# Patient Record
Sex: Female | Born: 1965 | Race: White | Hispanic: No | Marital: Single | Smoking: Never smoker
Health system: Southern US, Community
[De-identification: ages and names within clinical notes are randomized; demographics above are authoritative.]

## PROBLEM LIST (undated history)

## (undated) DIAGNOSIS — E7800 Pure hypercholesterolemia, unspecified: Secondary | ICD-10-CM

## (undated) HISTORY — PX: CARDIAC ELECTROPHYSIOLOGY STUDY AND ABLATION: SHX1294

---

## 2004-02-29 ENCOUNTER — Other Ambulatory Visit: Admission: RE | Admit: 2004-02-29 | Discharge: 2004-02-29 | Payer: Self-pay | Admitting: Family Medicine

## 2004-12-26 ENCOUNTER — Encounter: Admission: RE | Admit: 2004-12-26 | Discharge: 2004-12-26 | Payer: Self-pay | Admitting: Family Medicine

## 2005-12-28 ENCOUNTER — Encounter: Admission: RE | Admit: 2005-12-28 | Discharge: 2005-12-28 | Payer: Self-pay | Admitting: Obstetrics and Gynecology

## 2006-01-19 ENCOUNTER — Encounter: Admission: RE | Admit: 2006-01-19 | Discharge: 2006-01-19 | Payer: Self-pay | Admitting: Obstetrics and Gynecology

## 2007-01-21 ENCOUNTER — Encounter: Admission: RE | Admit: 2007-01-21 | Discharge: 2007-01-21 | Payer: Self-pay | Admitting: Obstetrics and Gynecology

## 2007-12-08 ENCOUNTER — Emergency Department (HOSPITAL_COMMUNITY): Admission: EM | Admit: 2007-12-08 | Discharge: 2007-12-08 | Payer: Self-pay | Admitting: Emergency Medicine

## 2008-01-16 ENCOUNTER — Ambulatory Visit: Payer: Self-pay | Admitting: Internal Medicine

## 2008-01-21 ENCOUNTER — Ambulatory Visit: Payer: Self-pay | Admitting: Internal Medicine

## 2008-01-21 LAB — CONVERTED CEMR LAB
BUN: 7 mg/dL (ref 6–23)
Basophils Absolute: 0 10*3/uL (ref 0.0–0.1)
Basophils Relative: 0.7 % (ref 0.0–1.0)
CO2: 26 meq/L (ref 19–32)
Calcium: 9.5 mg/dL (ref 8.4–10.5)
Chloride: 105 meq/L (ref 96–112)
Creatinine, Ser: 0.9 mg/dL (ref 0.4–1.2)
Eosinophils Absolute: 0.1 10*3/uL (ref 0.0–0.6)
Eosinophils Relative: 1.7 % (ref 0.0–5.0)
GFR calc Af Amer: 88 mL/min
GFR calc non Af Amer: 73 mL/min
Glucose, Bld: 90 mg/dL (ref 70–99)
HCT: 40.7 % (ref 36.0–46.0)
Hemoglobin: 13.5 g/dL (ref 12.0–15.0)
INR: 1 (ref 0.8–1.0)
Lymphocytes Relative: 31.1 % (ref 12.0–46.0)
MCHC: 33.1 g/dL (ref 30.0–36.0)
MCV: 93.1 fL (ref 78.0–100.0)
Monocytes Absolute: 0.5 10*3/uL (ref 0.2–0.7)
Monocytes Relative: 9.4 % (ref 3.0–11.0)
Neutro Abs: 3.3 10*3/uL (ref 1.4–7.7)
Neutrophils Relative %: 57.1 % (ref 43.0–77.0)
Platelets: 306 10*3/uL (ref 150–400)
Potassium: 4.3 meq/L (ref 3.5–5.1)
Prothrombin Time: 11.9 s (ref 10.9–13.3)
RBC: 4.37 M/uL (ref 3.87–5.11)
RDW: 12.3 % (ref 11.5–14.6)
Sodium: 139 meq/L (ref 135–145)
WBC: 5.6 10*3/uL (ref 4.5–10.5)
aPTT: 27.2 s (ref 21.7–29.8)

## 2008-01-22 ENCOUNTER — Encounter: Admission: RE | Admit: 2008-01-22 | Discharge: 2008-01-22 | Payer: Self-pay | Admitting: Family Medicine

## 2008-01-28 ENCOUNTER — Ambulatory Visit (HOSPITAL_COMMUNITY): Admission: RE | Admit: 2008-01-28 | Discharge: 2008-01-29 | Payer: Self-pay | Admitting: Internal Medicine

## 2008-01-28 ENCOUNTER — Ambulatory Visit: Payer: Self-pay | Admitting: Internal Medicine

## 2008-03-18 ENCOUNTER — Ambulatory Visit: Payer: Self-pay | Admitting: Internal Medicine

## 2008-04-20 ENCOUNTER — Other Ambulatory Visit: Admission: RE | Admit: 2008-04-20 | Discharge: 2008-04-20 | Payer: Self-pay | Admitting: Family Medicine

## 2009-01-22 ENCOUNTER — Encounter: Admission: RE | Admit: 2009-01-22 | Discharge: 2009-01-22 | Payer: Self-pay | Admitting: Obstetrics and Gynecology

## 2010-12-11 ENCOUNTER — Encounter: Payer: Self-pay | Admitting: Obstetrics and Gynecology

## 2011-04-04 NOTE — Discharge Summary (Signed)
NAMELORIJEAN, HUSSER NO.:  [id number]   MEDICAL RECORD NO.:  [id number]          PATIENT TYPE:  OIB   LOCATION:  3742                         FACILITY:  [facility]   PHYSICIAN:  Doylene Canning. [name], MD    DATE OF BIRTH:  February 22, 1966   DATE OF ADMISSION:  01/28/2008  DATE OF DISCHARGE:  01/29/2008                               DISCHARGE SUMMARY   PROCEDURES:  Electrophysiology study and supraventricular tachycardia  ablation.   PRIMARY FINAL DISCHARGE DIAGNOSIS:  Inducible AVNRT.   SECONDARY DIAGNOSES:  1. Hypertension  2. Dyslipidemia.   TIME OF DISCHARGE:  Thirty one minutes   HOSPITAL COURSE:  Ms. Klingbeil is a 45 year old female with no previous  history of coronary artery disease.  She has been treated by Dr. Everette Rank for SVT, and her medications have been up titrated to the point  that they included high dose beta blocker and Cardizem.  She was still  having breakthrough episodes and these were symptomatic.  She was  referred to Dr. [name] who felt that SVT ablation was indicated and she  came to the hospital for this procedure on January 28, 2008.   She had PVCs on a bundle of His but no pre excitation.  She had  inducible AVNRT which was ablated and successful slow PW modification.  She tolerated the procedure well.   On January 29, 2008, she was maintaining sinus rhythm.  Her sites were  without hematoma or oozing.  Her beta blocker had been decreased to 50  mg a day and a Cardizem was discontinued.  She is to continue on her  other home medications and the aspirin is cut back to 81 mg a day.  Dr.  [name] felt she could be safely discharged home and follow up as an  outpatient.   DISCHARGE INSTRUCTIONS:  She is to increase her activities slowly with  no lifting for a week.  She is to call our office for problems with cath  site.  She is encouraged to stick to a heart-healthy diet.  She will  follow up once with Dr. [name] on April 27 at 10:45.  She is to  follow  up with Dr. Eldridge Dace and Dr. Arvilla Market as needed or as scheduled.   DISCHARGE MEDICATIONS:  1. Toprol XL 100 mg 1/2 tablet daily.  2. Avicor 750/20 mg daily.  3. Yaz 28 one tablet daily.  4. Aspirin 81 mg a day.  5. Multiple vitamin daily.  6. Calcium citrate 400 mg daily.  7. Magnesium citrate 4 mg daily.  8. Omega III fish oil 3 tablets daily.  9. Glucosamine and chondroitin daily.      Theodore Demark, PA-C      Doylene Canning. [name], MD  Electronically Signed    RB/MEDQ  D:  01/29/2008  T:  01/30/2008  Job:  [id number]   cc:   Corky Crafts, MD  Donia Guiles, M.D.

## 2011-04-04 NOTE — Letter (Signed)
January 16, 2008    Corky Crafts, MD  [address] [place], [place] [address]   RE:  KEYSHA, DAMEWOOD  MRN:  [id number]  /  DOB:  Dec 01, 1965   Dear Vonna Kotyk:   Thank you for referring Ms. Izabelle Daus for EP evaluation.  As you know she  is a very pleasant 41 room with a history of tachy palpitations and  documented SVT at rates of over 220 beats per minute.  The patient was  seen today in our office and her history is basically as you described  in your notes in the past.  She notes that she has had palpitations off  and on for many, many years though in the last year or so they have  increased in frequency and severity despite being on beta-blockers and  now calcium channel blockers.  Her EKG is fairly impressive in that she  has a narrow QRS tachycardia at rates of approximately 220-240 beats per  minute.  The patient tachycardia in the past as terminated with PVCs.  She did not have any evidence of ventricular pre-excitation on 12-lead  EKG.   I have discussed the options with Ms. Berenguer in detail.  Because she has  had recurrent episodes of SVT, which are very fast despite medical  therapy with beta-blockers and now calcium channel blockers,  I have  recommend proceeding with catheter ablation of her SVT.  Based on  history, my suspicion that this she will have a concealed accessory  pathway responsible for  episodes of SVT.  Having said this, however, would like to proceed with  catheter ablation and this be scheduled earliest possible convenient  time.   Thanks again for referring Ms. Huhta for EP evaluation.    Sincerely,      Doylene Canning. [name], MD  Electronically Signed    GWT/MedQ  DD: 01/16/2008  DT: 01/17/2008  Job #: [id number]   CC:    Donia Guiles, M.D.

## 2011-04-04 NOTE — Assessment & Plan Note (Signed)
[facility]                         ELECTROPHYSIOLOGY OFFICE NOTE   NAME:Deborah Davenport, Deborah Davenport                             MRN:          [id number]  DATE:03/18/2008                            DOB:          [birth year]    Ms. Lightsey returns today for follow-up.  She is very pleasant young woman  with a history of SVT and hypertension who is status post catheter  ablation, who returns today for follow-up.  She has been stable.  She  has had no recurrent symptoms of SVT.  She denies chest pain.  She does  still complain of some fatigue but is still on Toprol 50 mg a day.  Other medicines include Advicor, Yaz, multiple vitamins and aspirin 81  mg a day.   PHYSICAL EXAM:  She is a pleasant, well-appearing young woman in no  distress.  Blood pressure 110/80, the pulse 73 and regular, respirations were 18.  The weight was 171 pounds.  NECK:  No jugular venous distention.  LUNGS:  Clear bilaterally to auscultation.  There are no wheezes, rales  or rhonchi.   The EKG demonstrates sinus rhythm with occasional PAC.   IMPRESSION:  1. Symptomatic supraventricular tachycardia.  2. Status post electrophysiology study and catheter ablation.   DISCUSSION:  Ms. Maxim is stable.  I have asked that she wean herself off  of her beta blocker by decreasing to 25 mg a day for a week and then 25  mg every other day for another week before stopping her beta blocker  altogether.  I will see her back in follow up on a p.r.n. basis.     Doylene Canning. [name], MD  Electronically Signed    GWT/MedQ  DD: 03/18/2008  DT: 03/18/2008  Job #: [id number]   cc:   Corky Crafts, MD  Donia Guiles, M.D.

## 2011-04-04 NOTE — Op Note (Signed)
Deborah Davenport, STONEHAM NO.:  [id number]   MEDICAL RECORD NO.:  [id number]          PATIENT TYPE:  OIB   LOCATION:  3742                         FACILITY:  [facility]   PHYSICIAN:  Deborah Davenport. Deborah Ridgel, MD    DATE OF BIRTH:  January 05, 1966   DATE OF PROCEDURE:  01/28/2008  DATE OF DISCHARGE:                               OPERATIVE REPORT   PROCEDURE PERFORMED:  Electrophysiologic study and radio frequency  catheter ablation of AV node reentry tachycardia.   INTRODUCTION:  The patient is a very pleasant 45 year old patient of Dr.  Eldridge Dace who has a history of tachy palpitations for many years.  She  notes that her heart racing began back as a teenager.  She has been  documented to have SVT at rates of 220 beats per minute despite medical  therapy with both Toprol and Cardizem.  She is now referred for catheter  ablation.   PROCEDURE:  After informed consent was obtained, the patient was taken  to the diagnostic EP lab in a fasting state.  After the usual  preparation and draping, intravenous fentanyl, midazolam, and Valium  were given for sedation.  A 6-French hexapolar catheter was inserted  percutaneously into the right jugular vein and advanced to the coronary  sinus.  A 5-French quadripolar catheter was inserted percutaneously in  the right femoral vein and advanced to the RV apex.  A 5-French  quadripolar catheter was inserted percutaneously in the right femoral  vein and advanced to the His bundle region.  This was all carried out  under fluoroscopic guidance.  During catheter manipulation, the patient  developed SVT at a cycle length of 340 to 330 millifractures and did not  demonstrate atrial pre-excitation.  Ventricular pacing during  tachycardia demonstrated VAV conduction sequence.  Mapping demonstrated  midline atrial activation.  At this point, the pacing was carried out  from the coronary sinus at 290 milliseconds, terminating the  tachycardia.  Rapid ventricular  pacing was then carried out from the RV  apex demonstrating a VA Wenckebach cycle length of 320 milliseconds.  During rapid ventricular pacing, the atrial activation was midline and  decremental.  Next, programmed ventricular stimulation was carried out  from the RV apex at a base drive cycle length of 045 milliseconds with  the S1-S2 interval stepwise decreased down to 230 milliseconds where  ventricular refractoriness was observed.  During programmed ventricular  stimulation, the atrial activation sequence was midline and decremental.  Next, programmed atrial stimulation was carried out from the coronary  sinus in the right atrium at a base drive cycle length of 409  milliseconds, the S1-S2 interval stepwise decreased down to 300  milliseconds resulting in the initiation of SVT.  Again, this was AV  node reentry tachycardia, the cycle length varied between 360 and 330  milliseconds.  The patient's tachycardia was terminated with pacing.  Next, rapid atrial pacing was carried out from the coronary sinus in the  high right atrium at a base drive cycle length of 811 milliseconds  stepwise decreased down to 350 milliseconds resulting  in the initiation  of tachycardia once again, terminated with rapid atrial pacing.  At this  point, a 7-French quadripolar ablation catheter was inserted into the  right femoral vein and advanced into the His bundle region and the  Koch's triangle region where mapping was carried out.  Mapping  demonstrated an unusually small Koch's triangle.  A total of two RF  energy applications were delivered to the slow pathway region.  During  RF energy application, there was accelerated junctional rhythm noted.  Following this, the patient was observed for 30 minutes.  During this  time, rapid atrial pacing and programmed atrial stimulation were again  carried out demonstrating no inducible SVT.  There was residual slow  pathway conduction with jumps and echo beats.   However, because of the  patient's extremely small slow pathway region, it was deemed not  appropriate to do any additional ablation for concerns of the  development of complete heart block.  It should be noted that the PR  interval was less than the RR interval following ablation.  At this  point, the catheters were removed, hemostasis was assured, and the  patient was returned to her room in satisfactory condition.   COMPLICATIONS:  There were no immediate procedure complications.   RESULTS:  A. Baseline ECG. The baseline ECG demonstrates sinus rhythm  with normal axis intervals.  There is no pre-excitation noted.  B. Baseline intervals.  The sinus node cycle length was 750  milliseconds.  QRS duration was 80 milliseconds.  The HV interval was 54  milliseconds.  C.  Rapid ventricular pacing.  Rapid ventricular pacing was carried out  from the RV apex demonstrating a VA Wenckebach cycle length of 320  milliseconds.  During rapid ventricular pacing, the atrial activation  sequence was midline and decremental.  D.  Programmed ventricular stimulation.  Programmed ventricular  stimulation was carried out from the RV apex at a base drive cycle  length of 045 milliseconds.  The S1-S2 interval was stepwise decreased  down to 230 milliseconds where activation was midline and decremental.  E.  Rapid atrial pacing.  Rapid atrial pacing was carried out from the  coronary sinus and the right atrium at a pacing cycle length of 600  milliseconds and stepwise decreased down to 350 milliseconds resulting  in the initiation of SVT.  Following ablation, rapid atrial pacing was  carried out down to 320 milliseconds where AV Wenckebach was observed  and there was no inducible SVT.  F.  Programmed atrial stimulation.  Programmed atrial stimulation was  carried out from the coronary sinus and high right atrium at a base  drive cycle length of 409 milliseconds.  The S1-S2 interval was stepwise  decreased  down to 230 milliseconds following ablation.  Prior to this,  an S1-S2 coupling interval of 500/300, there was inducible SVT.  G.  Arrhythmias observed.  1. AV node reentry tachycardia. Initiation was with programmed atrial      stimulation, rapid atrial pacing, end spontaneous, the duration was      sustained, the termination was either spontaneous or with rapid      atrial pacing.      a.     Mapping.  Mapping of Koch's triangle demonstrated an       unusually small Koch's triangle.      b.     RF energy application.  A total of two RF energy       applications were delivered.  During the  RF energy application,       there was accelerated junctional rhythm.   CONCLUSION:  This study demonstrates successful electrophysiologic and  RF catheter ablation with a total of two RF energy applications  delivered to a very small Koch's triangle resulting in rendering the  tachycardia not inducible.      Deborah Davenport. Deborah Ridgel, MD  Electronically Signed     GWT/MEDQ  D:  01/28/2008  T:  01/29/2008  Job:  [id number]   cc:   Corky Crafts, MD  Donia Guiles, M.D.

## 2011-04-04 NOTE — Assessment & Plan Note (Signed)
[facility]                         ELECTROPHYSIOLOGY OFFICE NOTE   NAME:Deborah Davenport, Deborah Davenport                             MRN:          [id number]  DATE:01/16/2008                            DOB:          June 05, 1966    HISTORY:  Ms. Ehler is referred today by Dr. Everette Rank for evaluation  of recurrent symptomatic SVT despite medical therapy.  The patient is a  very pleasant 45 year old woman with a history of tachy palpitations  dating back since she was a young adult.  These episodes initially were  very brief and very infrequent.  Over the last year or two her episodes  have increased in frequency and severity and recently she underwent  exercise treadmill testing where she went into SVT which was documented  to be a rates of way over 220 beats per minute.  She is now referred for  consideration for catheter ablation.  The patient has been on Toprol 100  a day and now Cardizem for several weeks.   PAST MEDICAL HISTORY:  Her past medical history is notable for  hypertension.  She also has a history of dyslipidemia.  The patient does  note that during her SVT she experiences dizziness and lightheadedness,  shortness of breath but does not have chest pressure and has never had  frank syncope.   FAMILY HISTORY:  Her family history is notable for both parents with  hypertension.  Her father has coronary disease.   SOCIAL HISTORY:  The patient is married.  She denies tobacco or ethanol  abuse.   REVIEW OF SYSTEMS:  Her review of systems is negative except as noted in  the HPI.   PHYSICAL EXAMINATION:  GENERAL:  The physical examination is notable for  her being a pleasant, well-appearing woman in no acute distress.  VITAL SIGNS:  The blood pressure was 125/75.  The pulse was 77 and  regular, respirations were 18.  The weight was 171 pounds.  HEENT:  Normocephalic and atraumatic.  Pupils equal and round.  Oropharynx moist.  Sclerae anicteric.  NECK:  The  neck revealed no jugular venous distention.  There was no  thyromegaly.  The trachea was midline.  Carotids are 2+ and symmetric.  LUNGS:  Clear bilaterally to auscultation.  No wheezes, rales or rhonchi  are present.  There is no increased work of breathing.  CARDIOVASCULAR:  Exam revealed a regular rate and rhythm with normal S1  and S2.  There are no murmurs, rubs, gallops present.  The PMI was not  laterally displaced nor was it enlarged.  ABDOMEN:  The abdominal exam was soft, nontender, nondistended.  There  was no organomegaly.  Bowel sounds are present.  There is no rebound or  guarding.  EXTREMITIES:  The extremities demonstrated no cyanosis, clubbing or  edema.  The pulses were 2+ and symmetric.  NEUROLOGIC:  Alert and oriented x3, cranial nerves II-XII intact.  Strength was 5/5 and symmetric.   EKG demonstrates sinus rhythm with normal axis and intervals.   IMPRESSION:  1. Recurrent episodes of supraventricular tachycardia despite medical  therapy.  2. Hypertension.  3. Dyslipidemia.   DISCUSSION:  I have discussed the treatment options with the patient in  detail.  The risks, benefits, goals of expectations of  electrophysiological study and catheter ablation of her SVT have been  discussed and she would like to proceed with catheter ablation as soon  as possible.  This will be scheduled at the earliest possible convenient  time.     Doylene Canning. [name], MD  Electronically Signed    GWT/MedQ  DD: 01/16/2008  DT: 01/17/2008  Job #: [id number]   cc:   Donia Guiles, M.D.  Corky Crafts, MD

## 2011-08-11 LAB — POCT CARDIAC MARKERS
CKMB, poc: 1
Myoglobin, poc: 80
Operator id: 257131
Troponin i, poc: <0.05

## 2011-08-11 LAB — DIFFERENTIAL
Basophils Absolute: 0
Basophils Relative: 1
Eosinophils Absolute: 0.1
Eosinophils Relative: 1
Lymphocytes Relative: 25
Lymphs Abs: 1.4
Monocytes Absolute: 0.4
Monocytes Relative: 7
Neutro Abs: 3.7
Neutrophils Relative %: 66

## 2011-08-11 LAB — BASIC METABOLIC PANEL
BUN: 9
CO2: 23
Calcium: 9
Chloride: 109
Creatinine, Ser: 0.87
GFR calc Af Amer: >60
GFR calc non Af Amer: >60
Glucose, Bld: 99
Potassium: 3.1 — ABNORMAL LOW
Sodium: 139

## 2011-08-11 LAB — CBC
HCT: 40.8
Hemoglobin: 13.8
MCHC: 33.8
MCV: 91.2
Platelets: 323
RBC: 4.48
RDW: 13.5
WBC: 5.6

## 2011-08-11 LAB — TSH: TSH: 2.33

## 2012-04-08 ENCOUNTER — Encounter (HOSPITAL_COMMUNITY): Payer: Self-pay | Admitting: Emergency Medicine

## 2012-04-08 ENCOUNTER — Emergency Department (HOSPITAL_COMMUNITY): Payer: No Typology Code available for payment source

## 2012-04-08 ENCOUNTER — Emergency Department (HOSPITAL_COMMUNITY)
Admission: EM | Admit: 2012-04-08 | Discharge: 2012-04-08 | Disposition: A | Discharge status: Home / Self Care | Payer: No Typology Code available for payment source | Attending: Emergency Medicine | Admitting: Emergency Medicine

## 2012-04-08 DIAGNOSIS — S2220XA Unspecified fracture of sternum, initial encounter for closed fracture: Secondary | ICD-10-CM | POA: Insufficient documentation

## 2012-04-08 DIAGNOSIS — Y92410 Unspecified street and highway as the place of occurrence of the external cause: Secondary | ICD-10-CM | POA: Insufficient documentation

## 2012-04-08 HISTORY — DX: Pure hypercholesterolemia, unspecified: E78.00

## 2012-04-08 LAB — POCT I-STAT, CHEM 8
BUN: 6 mg/dL (ref 6–23)
Calcium, Ion: 1.19 mmol/L (ref 1.12–1.32)
Chloride: 108 mEq/L (ref 96–112)
Creatinine, Ser: 0.8 mg/dL (ref 0.50–1.10)
Glucose, Bld: 106 mg/dL — ABNORMAL HIGH (ref 70–99)
HCT: 39 % (ref 36.0–46.0)
Hemoglobin: 13.3 g/dL (ref 12.0–15.0)
Potassium: 3.5 mEq/L (ref 3.5–5.1)
Sodium: 143 mEq/L (ref 135–145)
TCO2: 24 mmol/L (ref 0–100)

## 2012-04-08 MED ORDER — IBUPROFEN 800 MG PO TABS
800.0000 mg | ORAL_TABLET | Freq: Once | ORAL | Status: AC
  Administered 2012-04-08: 800 mg via ORAL
  Filled 2012-04-08: qty 1

## 2012-04-08 MED ORDER — HYDROCODONE-ACETAMINOPHEN 5-325 MG PO TABS: 1.0000 | ORAL_TABLET | Freq: Four times a day (QID) | ORAL | Status: AC | PRN

## 2012-04-08 MED ORDER — CYCLOBENZAPRINE HCL 10 MG PO TABS: 10.0000 mg | ORAL_TABLET | Freq: Two times a day (BID) | ORAL | Status: AC | PRN

## 2012-04-08 MED ORDER — IOHEXOL 300 MG/ML  SOLN
80.0000 mL | Freq: Once | INTRAMUSCULAR | Status: AC | PRN
  Administered 2012-04-08: 80 mL via INTRAVENOUS

## 2012-04-08 NOTE — ED Notes (Signed)
Patient transported to CT 
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
## 2012-08-05 ENCOUNTER — Other Ambulatory Visit: Payer: Self-pay | Admitting: Obstetrics and Gynecology

## 2012-08-05 DIAGNOSIS — R928 Other abnormal and inconclusive findings on diagnostic imaging of breast: Secondary | ICD-10-CM

## 2012-08-07 ENCOUNTER — Ambulatory Visit
Admission: RE | Admit: 2012-08-07 | Discharge: 2012-08-07 | Disposition: A | Discharge status: Home / Self Care | Payer: BC Managed Care – PPO | Source: Ambulatory Visit | Attending: Obstetrics and Gynecology | Admitting: Obstetrics and Gynecology

## 2012-08-07 DIAGNOSIS — R928 Other abnormal and inconclusive findings on diagnostic imaging of breast: Secondary | ICD-10-CM

## 2012-09-30 ENCOUNTER — Other Ambulatory Visit: Payer: Self-pay | Admitting: Family Medicine

## 2012-09-30 ENCOUNTER — Ambulatory Visit
Admission: RE | Admit: 2012-09-30 | Discharge: 2012-09-30 | Disposition: A | Discharge status: Home / Self Care | Payer: BC Managed Care – PPO | Source: Ambulatory Visit | Attending: Family Medicine | Admitting: Family Medicine

## 2012-09-30 DIAGNOSIS — M25559 Pain in unspecified hip: Secondary | ICD-10-CM

## 2013-09-13 IMAGING — CR DG PELVIS 1-2V
1 series · 1 of 1 positions shown · non-contrast
Comparison: None.

CLINICAL DATA: Right hip pain for 3 weeks, no recent injury

PELVIS - 1-2 VIEW

[t pelvis a.p.]
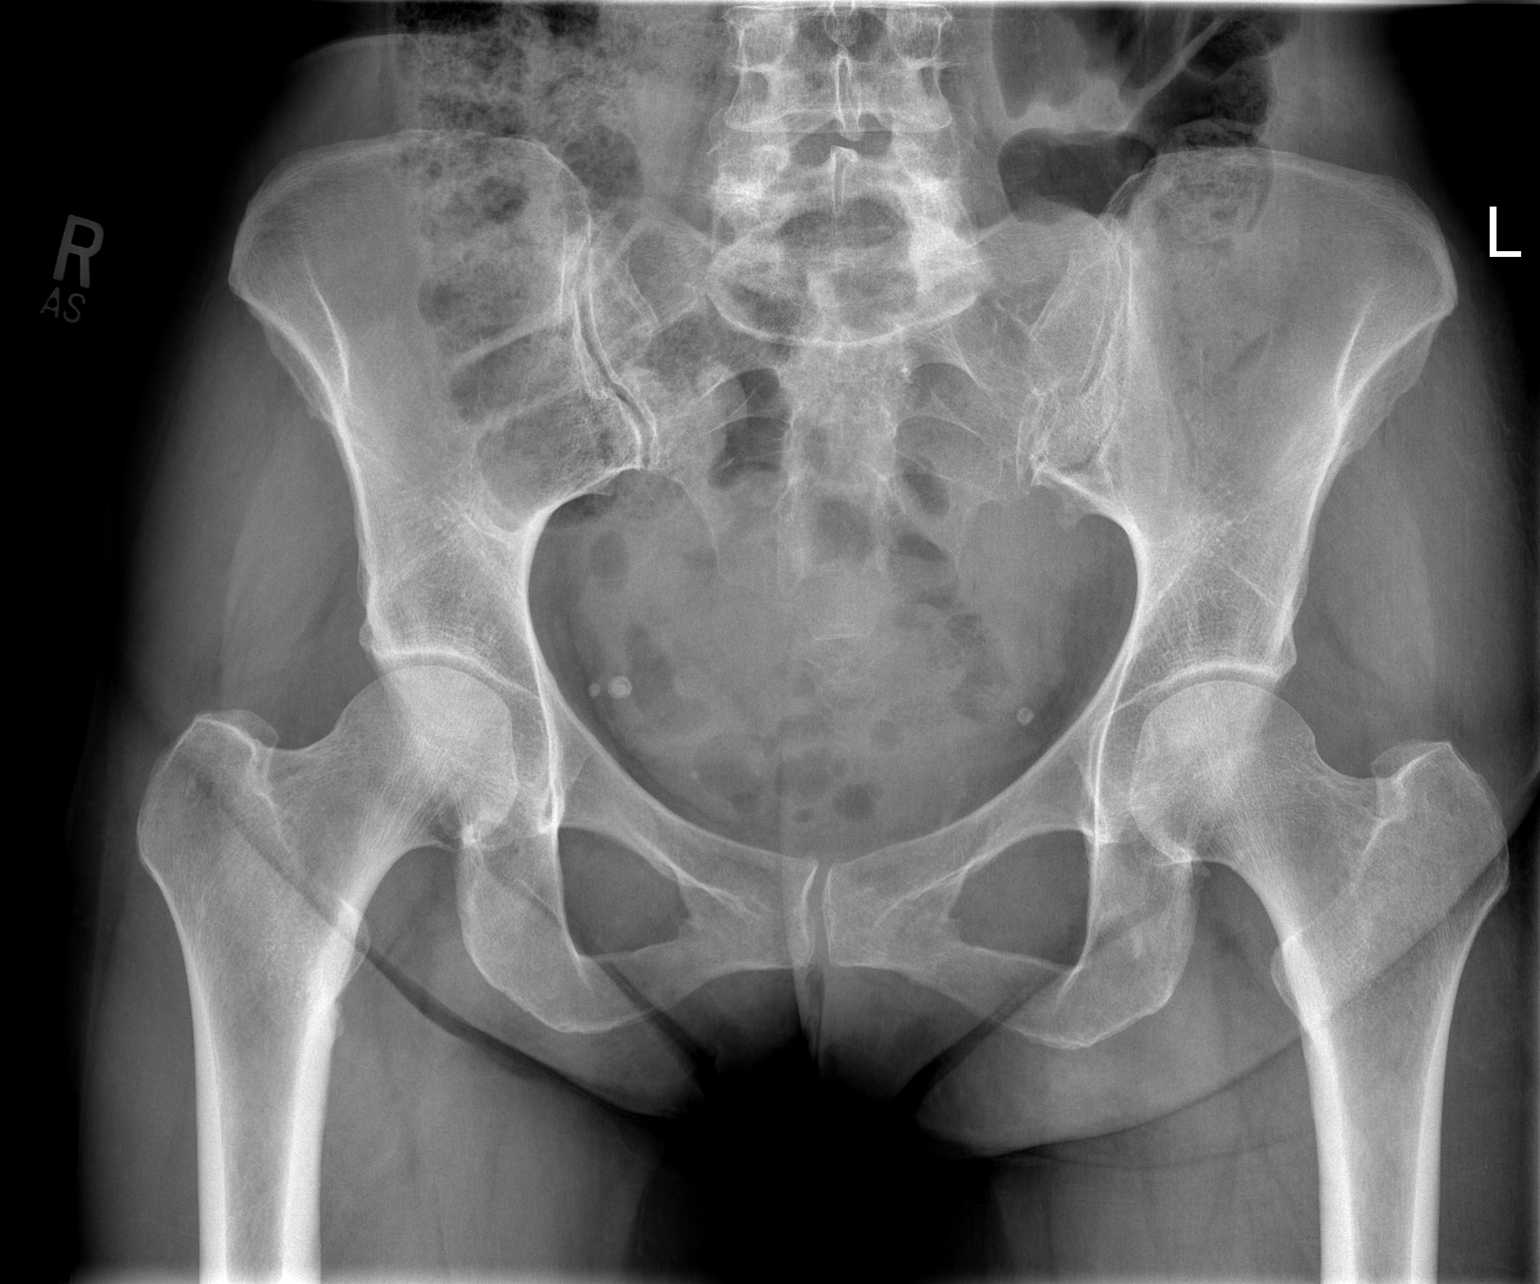

[1 of 1 positions shown; findings below may reference images not displayed]

FINDINGS: Both hip joint spaces are within normal limits.  No
significant degenerative change is seen.  The pelvic rami are
intact.  The SI joints are corticated.
IMPRESSION: Negative.

## 2013-09-13 IMAGING — CR DG HIP COMPLETE 2+V*R*
2 series · 2 of 2 positions shown · non-contrast
Comparison: None

CLINICAL DATA: Right hip pain 3 weeks, no recent injury

RIGHT HIP - COMPLETE 2+ VIEW

[t hip ap right]
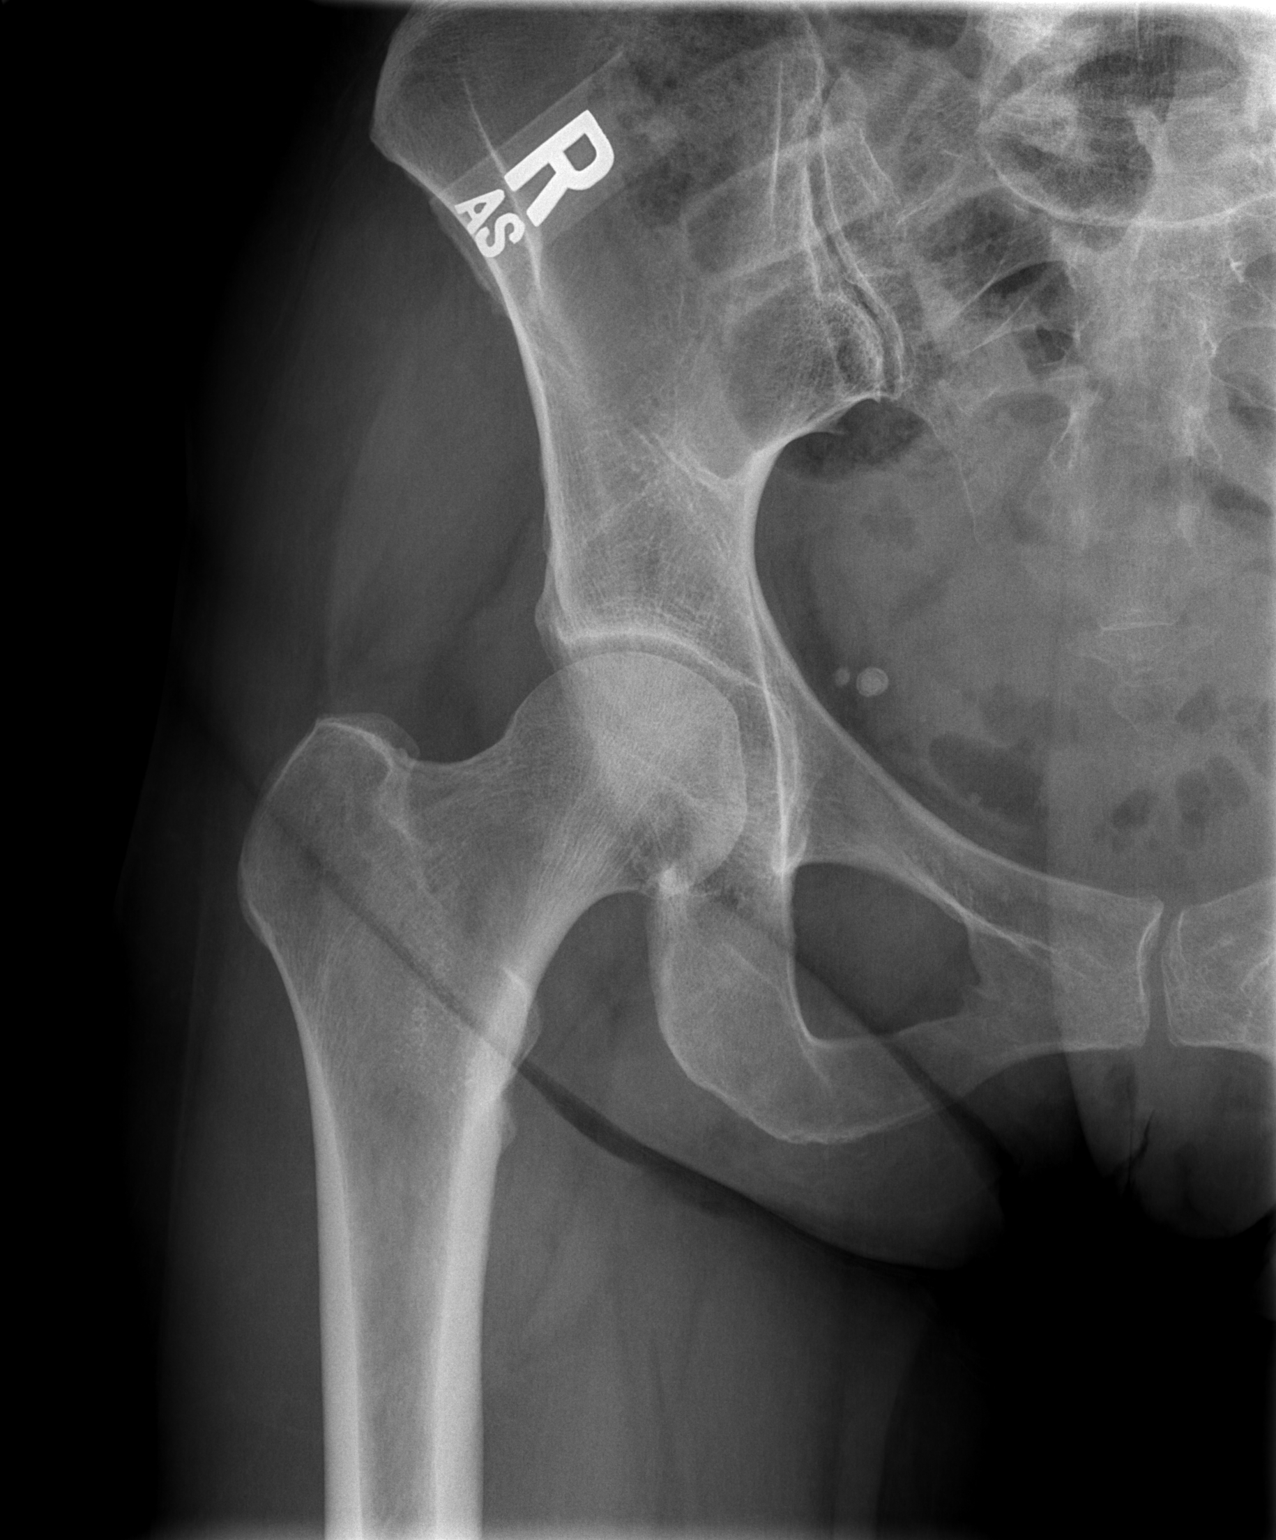

[t hip frog leg right]
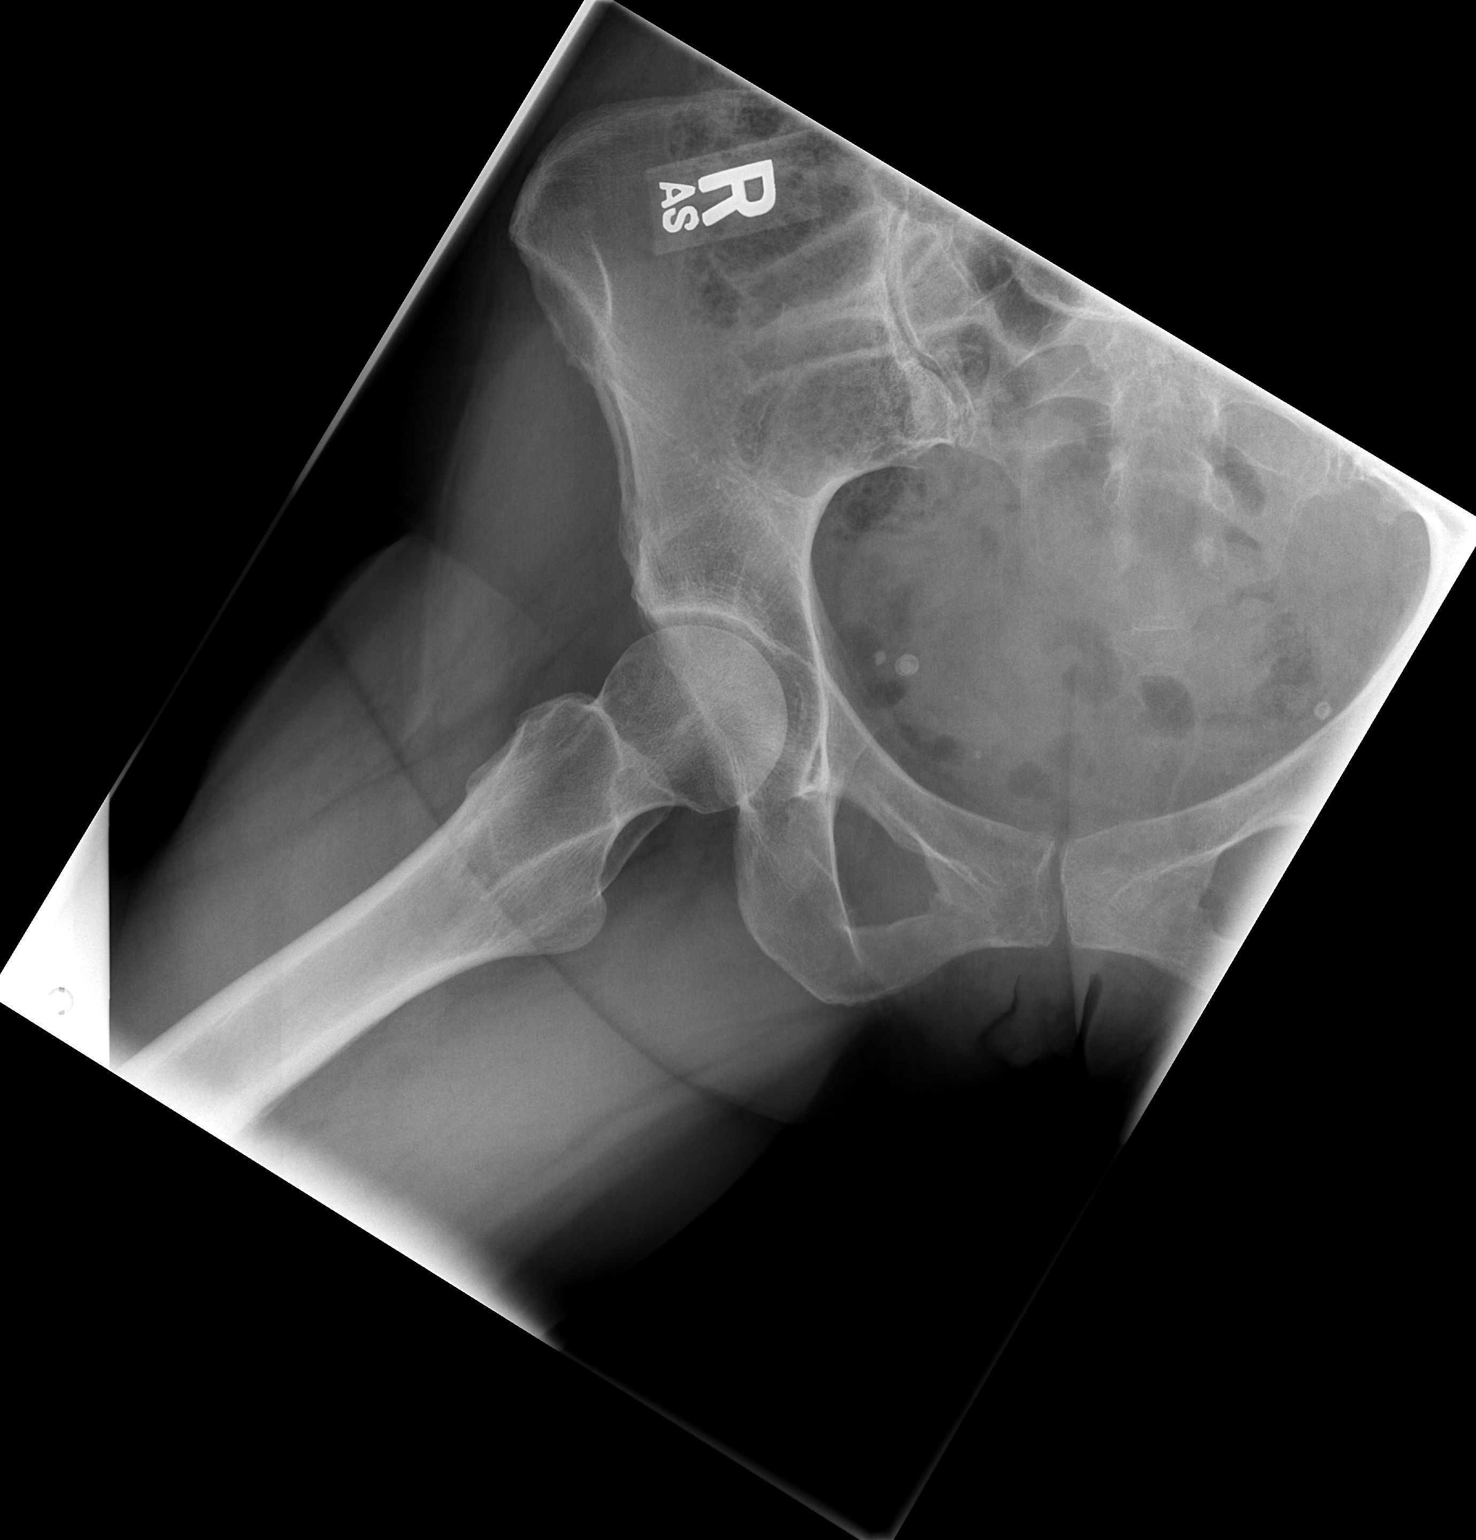

[2 of 2 positions shown; findings below may reference images not displayed]

FINDINGS: The right hip joint space appears normal.  No significant
degenerative change is seen.  No acute bony abnormality is noted.
The right ramus is intact.  The right SI joint appears normal.
IMPRESSION: Negative.]

## 2014-10-28 ENCOUNTER — Ambulatory Visit
Admission: RE | Admit: 2014-10-28 | Discharge: 2014-10-28 | Disposition: A | Discharge status: Home / Self Care | Payer: BC Managed Care – PPO | Source: Ambulatory Visit | Attending: Physician Assistant | Admitting: Physician Assistant

## 2014-10-28 ENCOUNTER — Other Ambulatory Visit: Payer: Self-pay | Admitting: Physician Assistant

## 2014-10-28 DIAGNOSIS — M533 Sacrococcygeal disorders, not elsewhere classified: Secondary | ICD-10-CM

## 2015-11-08 ENCOUNTER — Encounter: Payer: Self-pay | Admitting: Skilled Nursing Facility1

## 2015-11-08 ENCOUNTER — Encounter: Payer: BLUE CROSS/BLUE SHIELD | Attending: Family Medicine | Admitting: Skilled Nursing Facility1

## 2015-11-08 VITALS — Ht 67.0 in | Wt 199.0 lb

## 2015-11-08 DIAGNOSIS — Z713 Dietary counseling and surveillance: Secondary | ICD-10-CM | POA: Diagnosis not present

## 2015-11-08 DIAGNOSIS — E669 Obesity, unspecified: Secondary | ICD-10-CM

## 2015-11-08 DIAGNOSIS — Z6831 Body mass index (BMI) 31.0-31.9, adult: Secondary | ICD-10-CM | POA: Insufficient documentation
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
## 2015-11-08 NOTE — Patient Instructions (Addendum)
-  Make your bedroom conducive to sleep -Focus on your hunger and fullness cues -It is okay to eat sweets -To make your breakfast balanced: fruit, cereal, nuts -Try to be physically active three days a week -Only 1/2 cup cereal

## 2017-10-09 ENCOUNTER — Other Ambulatory Visit: Payer: Self-pay | Admitting: Family Medicine

## 2017-10-09 ENCOUNTER — Ambulatory Visit
Admission: RE | Admit: 2017-10-09 | Discharge: 2017-10-09 | Disposition: A | Discharge status: Home / Self Care | Payer: BLUE CROSS/BLUE SHIELD | Source: Ambulatory Visit | Attending: Family Medicine | Admitting: Family Medicine

## 2017-10-09 DIAGNOSIS — R1011 Right upper quadrant pain: Secondary | ICD-10-CM

## 2018-04-17 ENCOUNTER — Ambulatory Visit
Admission: RE | Admit: 2018-04-17 | Discharge: 2018-04-17 | Disposition: A | Discharge status: Home / Self Care | Payer: BLUE CROSS/BLUE SHIELD | Source: Ambulatory Visit | Attending: Family Medicine | Admitting: Family Medicine

## 2018-04-17 ENCOUNTER — Other Ambulatory Visit: Payer: Self-pay | Admitting: Family Medicine

## 2018-04-17 DIAGNOSIS — R05 Cough: Secondary | ICD-10-CM

## 2018-04-17 DIAGNOSIS — R059 Cough, unspecified: Secondary | ICD-10-CM

## 2018-04-25 ENCOUNTER — Ambulatory Visit
Admission: RE | Admit: 2018-04-25 | Discharge: 2018-04-25 | Disposition: A | Discharge status: Home / Self Care | Payer: BLUE CROSS/BLUE SHIELD | Source: Ambulatory Visit | Attending: Physician Assistant | Admitting: Physician Assistant

## 2018-04-25 ENCOUNTER — Other Ambulatory Visit: Payer: Self-pay | Admitting: Physician Assistant

## 2018-04-25 DIAGNOSIS — J189 Pneumonia, unspecified organism: Secondary | ICD-10-CM

## 2018-05-07 ENCOUNTER — Other Ambulatory Visit: Payer: Self-pay | Admitting: Family Medicine

## 2018-05-07 DIAGNOSIS — J189 Pneumonia, unspecified organism: Secondary | ICD-10-CM

## 2018-05-07 DIAGNOSIS — J181 Lobar pneumonia, unspecified organism: Principal | ICD-10-CM

## 2018-05-22 ENCOUNTER — Ambulatory Visit
Admission: RE | Admit: 2018-05-22 | Discharge: 2018-05-22 | Disposition: A | Discharge status: Home / Self Care | Payer: BLUE CROSS/BLUE SHIELD | Source: Ambulatory Visit | Attending: Family Medicine | Admitting: Family Medicine

## 2018-05-22 DIAGNOSIS — J189 Pneumonia, unspecified organism: Secondary | ICD-10-CM

## 2018-05-22 DIAGNOSIS — J181 Lobar pneumonia, unspecified organism: Principal | ICD-10-CM

## 2018-05-22 MED ORDER — IOPAMIDOL (ISOVUE-300) INJECTION 61%
75.0000 mL | Freq: Once | INTRAVENOUS | Status: AC | PRN
  Administered 2018-05-22: 75 mL via INTRAVENOUS

## 2019-04-08 IMAGING — DX DG CHEST 2V
2 series · 2 of 2 positions shown · non-contrast
Comparison: Radiographs April 17, 2018.

CLINICAL DATA: Pneumonia.

EXAM:
CHEST - 2 VIEW

[dg chest 2 view (1 of 2)]
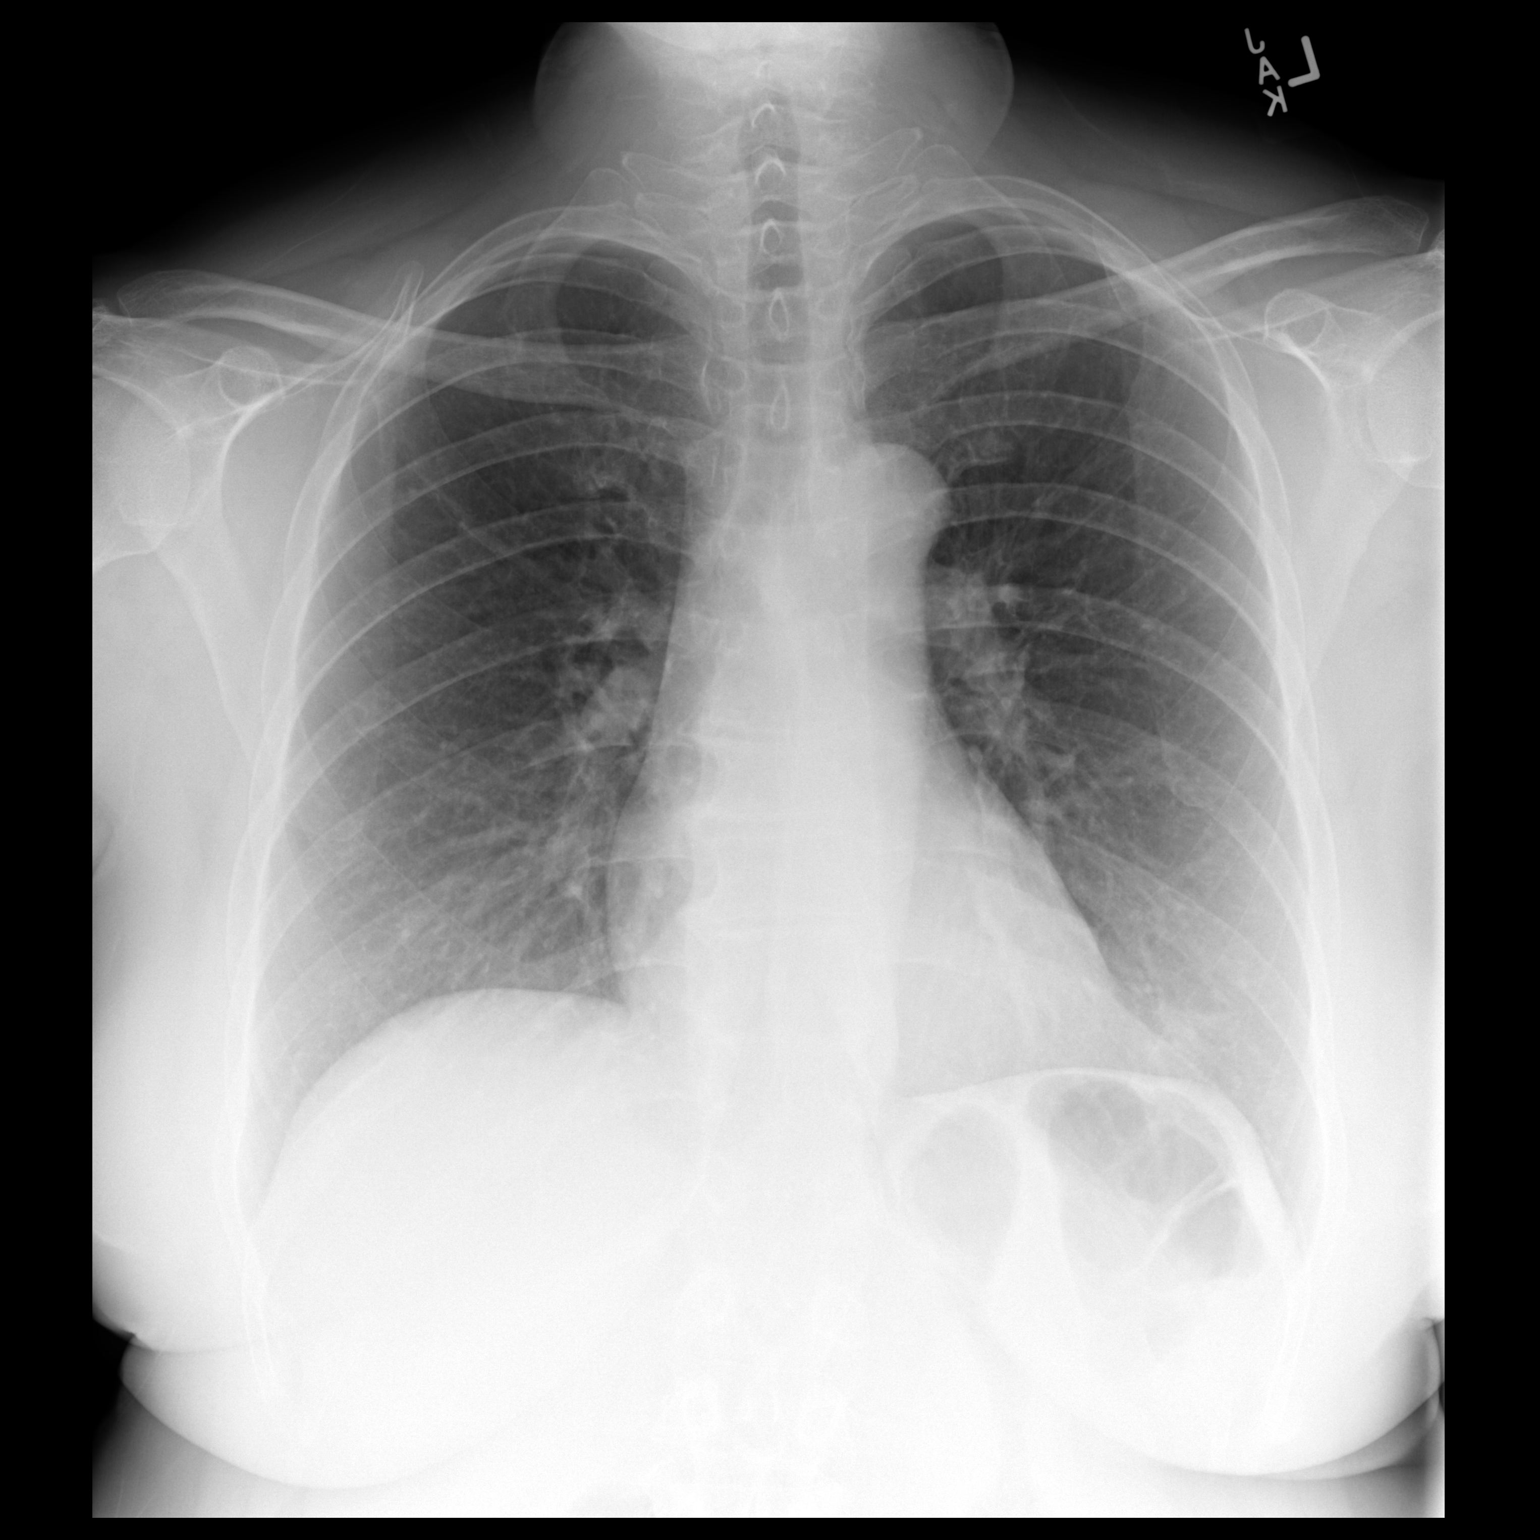

[dg chest 2 view (2 of 2)]
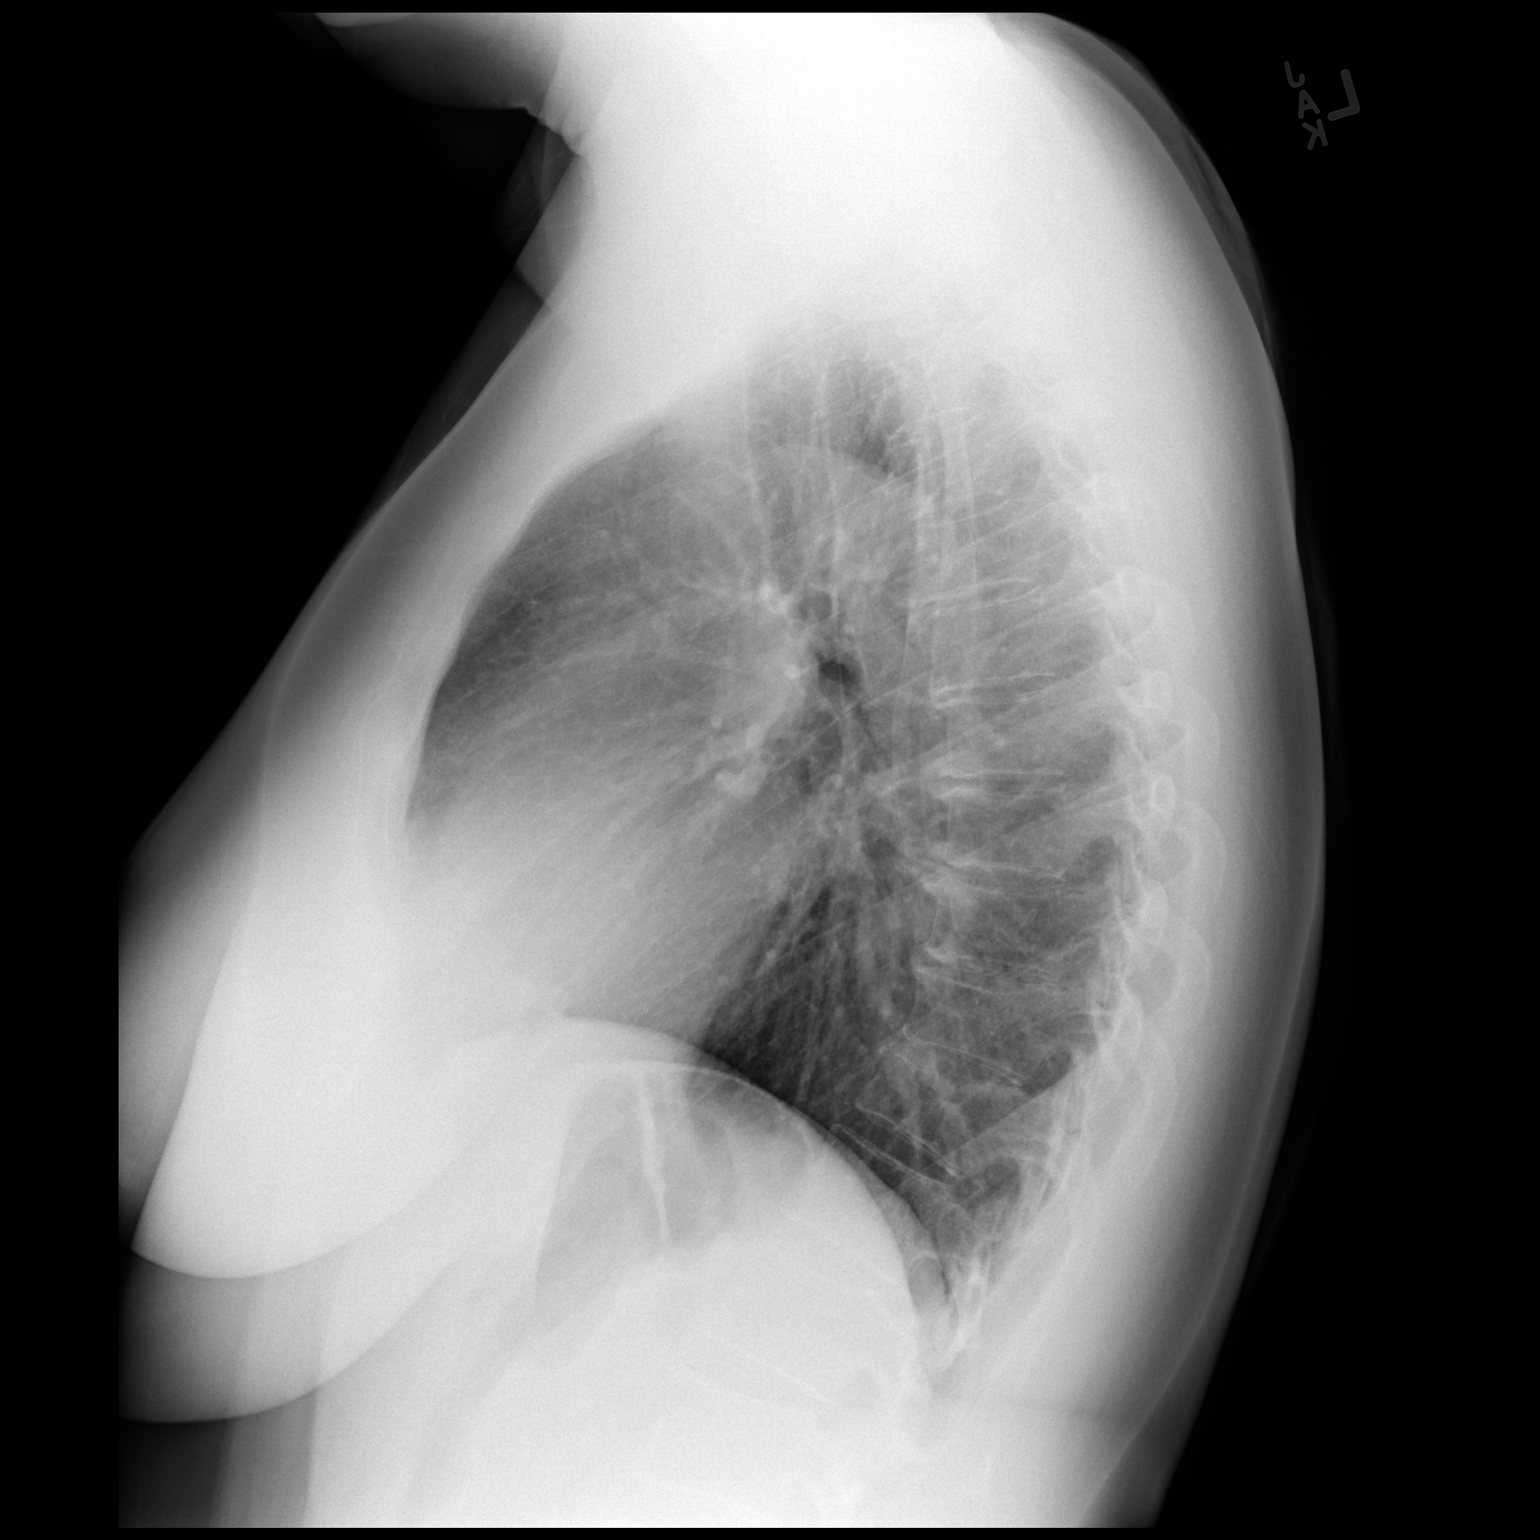

[2 of 2 positions shown; findings below may reference images not displayed]

FINDINGS: The heart size and mediastinal contours are within normal limits. No
pneumothorax or pleural effusion is noted. Right lung is clear.
Stable minimal density is seen in left lung base which may represent
focal atelectasis or possibly scarring. The visualized skeletal
structures are unremarkable.
IMPRESSION: Stable minimal density seen in left lung base which may represent
focal atelectasis or possibly scarring.

## 2020-12-08 ENCOUNTER — Other Ambulatory Visit: Payer: Self-pay | Admitting: Family Medicine

## 2020-12-08 DIAGNOSIS — M8580 Other specified disorders of bone density and structure, unspecified site: Secondary | ICD-10-CM

## 2021-04-06 ENCOUNTER — Other Ambulatory Visit: Payer: Self-pay

## 2021-04-06 ENCOUNTER — Ambulatory Visit
Admission: RE | Admit: 2021-04-06 | Discharge: 2021-04-06 | Disposition: A | Discharge status: Home / Self Care | Payer: No Typology Code available for payment source | Source: Ambulatory Visit | Attending: Family Medicine | Admitting: Family Medicine

## 2021-04-06 DIAGNOSIS — M8580 Other specified disorders of bone density and structure, unspecified site: Secondary | ICD-10-CM

## 2022-12-26 ENCOUNTER — Other Ambulatory Visit: Payer: Self-pay | Admitting: Family Medicine

## 2022-12-26 DIAGNOSIS — M8580 Other specified disorders of bone density and structure, unspecified site: Secondary | ICD-10-CM

## 2023-05-30 ENCOUNTER — Ambulatory Visit
Admission: RE | Admit: 2023-05-30 | Discharge: 2023-05-30 | Disposition: A | Discharge status: Home / Self Care | Payer: No Typology Code available for payment source | Source: Ambulatory Visit | Attending: Family Medicine | Admitting: Family Medicine

## 2023-05-30 DIAGNOSIS — M8580 Other specified disorders of bone density and structure, unspecified site: Secondary | ICD-10-CM

## 2023-12-28 ENCOUNTER — Other Ambulatory Visit (HOSPITAL_COMMUNITY): Payer: Self-pay | Admitting: Family Medicine

## 2023-12-28 DIAGNOSIS — E7800 Pure hypercholesterolemia, unspecified: Secondary | ICD-10-CM

## 2024-01-10 ENCOUNTER — Ambulatory Visit (HOSPITAL_COMMUNITY)
Admission: RE | Admit: 2024-01-10 | Discharge: 2024-01-10 | Disposition: A | Discharge status: Home / Self Care | Payer: Self-pay | Source: Ambulatory Visit | Attending: Family Medicine | Admitting: Family Medicine

## 2024-01-10 DIAGNOSIS — E7800 Pure hypercholesterolemia, unspecified: Secondary | ICD-10-CM | POA: Insufficient documentation

## 2024-10-15 ENCOUNTER — Ambulatory Visit

## 2024-10-15 ENCOUNTER — Ambulatory Visit
Admission: RE | Admit: 2024-10-15 | Discharge: 2024-10-15 | Disposition: A | Discharge status: Home / Self Care | Source: Ambulatory Visit

## 2024-10-15 ENCOUNTER — Ambulatory Visit (INDEPENDENT_AMBULATORY_CARE_PROVIDER_SITE_OTHER): Admitting: Radiology

## 2024-10-15 VITALS — BP 144/88 | HR 76 | Temp 97.9°F | Resp 18

## 2024-10-15 DIAGNOSIS — M25512 Pain in left shoulder: Secondary | ICD-10-CM

## 2024-10-15 DIAGNOSIS — S4992XA Unspecified injury of left shoulder and upper arm, initial encounter: Secondary | ICD-10-CM | POA: Diagnosis not present

## 2024-10-15 MED ORDER — CYCLOBENZAPRINE HCL 10 MG PO TABS: 10.0000 mg | ORAL_TABLET | Freq: Every evening | ORAL | 0 refills | Status: AC | PRN

## 2024-10-15 MED ORDER — PREDNISONE 20 MG PO TABS: 40.0000 mg | ORAL_TABLET | Freq: Every day | ORAL | 0 refills | Status: AC

## 2024-10-15 NOTE — ED Provider Notes (Signed)
 [facility]    CSN: [id number] Arrival date & time: 10/15/24  9077      History   Chief Complaint Chief Complaint  Patient presents with   Shoulder Pain    Muscle pain in chest in front of collar bone that goes back to shoulder blade. Hurts with certain movements and deep breath. - Entered by patient    HPI Deborah Davenport is a 58 y.o. female.  1 week of left side shoulder pain She feels it in the left collar bone, radiates to her shoulder blade. Worse with certain movements and deep breath. Rates 7/10 pain at it's worst. Denies any radiation into the extremities. No weakness or paresthesias Not having neck pain or stiffness  Denies prior injury to this area  Has tried ibuprofen  and excedrin  Osteopenia history  Past Medical History:  Diagnosis Date   Hypercholesterolemia     There are no active problems to display for this patient.   Past Surgical History:  Procedure Laterality Date   CARDIAC ELECTROPHYSIOLOGY STUDY AND ABLATION      OB History   No obstetric history on file.      Home Medications    Prior to Admission medications   Medication Sig Start Date End Date Taking? Authorizing Provider  cyclobenzaprine  (FLEXERIL ) 10 MG tablet Take 1 tablet (10 mg total) by mouth at bedtime as needed for muscle spasms. 10/15/24  Yes Kayton Dunaj, Asberry, PA-C  metFORMIN (GLUCOPHAGE-XR) 500 MG 24 hr tablet Take 500 mg by mouth 2 (two) times daily. 09/01/24  Yes [provider]  predniSONE  (DELTASONE ) 20 MG tablet Take 2 tablets (40 mg total) by mouth daily with breakfast for 5 days. 10/15/24 10/20/24 Yes Flannery Cavallero, Asberry, PA-C  rosuvastatin (CRESTOR) 20 MG tablet Take 20 mg by mouth at bedtime. 09/19/24  Yes [provider]  Topiramate ER (TROKENDI XR) 50 MG CP24 Take 2 capsules by mouth daily. 09/15/24  Yes [provider]  amitriptyline (ELAVIL) 50 MG tablet Take 50 mg by mouth at bedtime.    [provider]  aspirin EC 81 MG  tablet Take 81 mg by mouth daily.    [provider]  aspirin-acetaminophen -caffeine (EXCEDRIN MIGRAINE) 250-250-65 MG per tablet Take 1 tablet by mouth every 6 (six) hours as needed. For headache    [provider]  Boswellia-Glucosamine-Vit D (GLUCOSAMINE COMPLEX PO) Take 1 tablet by mouth daily.    [provider]  cetirizine (ZYRTEC) 10 MG tablet Take 10 mg by mouth daily.    [provider]  fish oil-omega-3 fatty acids 1000 MG capsule Take 2 g by mouth daily.    [provider]  magnesium oxide (MAG-OX) 400 MG tablet Take 400 mg by mouth daily.    [provider]  Multiple Vitamin (MULITIVITAMIN WITH MINERALS) TABS Take 1 tablet by mouth daily.    [provider]  niacin-lovastatin (ADVICOR) 750-20 MG 24 hr tablet Take 1 tablet by mouth at bedtime.    [provider]  simvastatin (ZOCOR) 40 MG tablet 1 tablet every day by oral route.    [provider]  SUMAtriptan (IMITREX) 100 MG tablet SMARTSIG:1 Tablet(s) By Mouth Once a Week PRN    [provider]    Family History Family History  Problem Relation Age of Onset   Hypertension Other    Hyperlipidemia Other    Cancer Other     Social History Social History   Tobacco Use   Smoking status: Never  Substance  Use Topics   Alcohol use: No   Drug use: No     Allergies   Patient has no known allergies.   Review of Systems Review of Systems   Physical Exam Triage Vital Signs ED Triage Vitals  Encounter Vitals Group     BP 10/15/24 0930 (!) 144/88     Girls Systolic BP Percentile --      Girls Diastolic BP Percentile --      Boys Systolic BP Percentile --      Boys Diastolic BP Percentile --      Pulse Rate 10/15/24 0930 76     Resp 10/15/24 0930 18     Temp 10/15/24 0930 97.9 F (36.6 C)     Temp Source 10/15/24 0930 Oral     SpO2 10/15/24 0930 97 %     Weight --      Height --      Head Circumference --      Peak Flow --       Pain Score 10/15/24 0935 7     Pain Loc --      Pain Education --      Exclude from Growth Chart --    No data found.  Updated Vital Signs BP (!) 144/88 (BP Location: Right Arm)   Pulse 76   Temp 97.9 F (36.6 C) (Oral)   Resp 18   LMP 09/30/2012   SpO2 97%   Physical Exam Vitals and nursing note reviewed.  Constitutional:      General: She is not in acute distress. HENT:     Mouth/Throat:     Pharynx: Oropharynx is clear.  Cardiovascular:     Rate and Rhythm: Normal rate and regular rhythm.     Pulses: Normal pulses.     Heart sounds: Normal heart sounds.  Pulmonary:     Effort: Pulmonary effort is normal. No respiratory distress.     Breath sounds: Normal breath sounds. No wheezing or rales.  Musculoskeletal:     Left shoulder: Tenderness present.       Arms:     Cervical back: Normal range of motion.     Comments: Reproducible tenderness left upper chest, just inferior to clavicle. Radiates through to the left upper back. The back portion is not reproducible. No bony tenderness of clavicle, scapula, cervical spine, AC joint, anterior humerus. Full ROM of the upper extremities. Strength 5/5. Sensation equal and intact. Radial pulses 2+  Skin:    General: Skin is warm and dry.     Capillary Refill: Capillary refill takes less than 2 seconds.     Comments: No rash, redness, erythema, bruising   Neurological:     Mental Status: She is alert and oriented to person, place, and time.     UC Treatments / Results  Labs (all labs ordered are listed, but only abnormal results are displayed) Labs Reviewed - No data to display  EKG  Radiology DG Shoulder Left Result Date: 10/15/2024 EXAM: 1 VIEW(S) XRAY OF THE LEFT SHOULDER 10/15/2024 10:25:21 AM COMPARISON: None available. CLINICAL HISTORY: Left side shoulder pain, inferior to clavicle that radiates to scapula, certain movements worse. Looking for bony spurs or degenerative changes, possible joint space widening.  FINDINGS: BONES AND JOINTS: Glenohumeral joint is normally aligned. No acute fracture or dislocation. The [facility] joint is unremarkable in appearance. SOFT TISSUES: No abnormal calcifications. Visualized lung is unremarkable. IMPRESSION: 1. No significant abnormality. Electronically signed by: Evalene Coho MD 10/15/2024 10:42 AM EST  RP Workstation: [id number]    Procedures Procedures   Medications Ordered in UC Medications - No data to display  Initial Impression / Assessment and Plan / UC Course  I have reviewed the triage vital signs and the nursing notes.  Pertinent labs & imaging results that were available during my care of the patient were reviewed by me and considered in my medical decision making (see chart for details).  Had blood work with PCP in February [facility]). Patient able to pull up results on her phone. Creatinine 0.88 and GFR 75. No DM history.  Xray today with cortical irregularity of distal clavicle, seen on image review by this provider.  Radiology does not read abnormality. Discussed with patient   Prednisone  burst for pain, inflammation Try flexeril  at bedtime Orthopedic follow up is recommended for further eval and management Patient agrees to plan  Final Clinical Impressions(s) / UC Diagnoses   Final diagnoses:  Acute pain of left shoulder  Injury of left clavicle, initial encounter     Discharge Instructions      Your xray shows some irregularity of the clavicle bone. Sometimes we see this with an old fracture that is healing. It's likely the cause of your pain.   Prednisone  -- 2 tablets daily for 5 days. This is a steroid for pain and inflammation.  Do not use NSAIDs while taking this. (Ibuprofen , Advil , Aleve, naproxen) You can use tylenol  safely   Flexeril  -- 1 tablet at bedtime as needed. This is a muscle relaxer. It might make you sleepy!  Please call orthopedics to make a follow up appointment.     ED Prescriptions     Medication Sig  Dispense Auth. Provider   predniSONE  (DELTASONE ) 20 MG tablet Take 2 tablets (40 mg total) by mouth daily with breakfast for 5 days. 10 tablet Shakirah Kirkey, PA-C   cyclobenzaprine  (FLEXERIL ) 10 MG tablet Take 1 tablet (10 mg total) by mouth at bedtime as needed for muscle spasms. 20 tablet Sible Straley, Asberry, PA-C      PDMP not reviewed this encounter.   Jeryl Asberry, PA-C 10/15/24 1053

## 2024-10-15 NOTE — Discharge Instructions (Addendum)
 Your xray shows some irregularity of the clavicle bone. Sometimes we see this with an old fracture that is healing. It's likely the cause of your pain.   Prednisone  -- 2 tablets daily for 5 days. This is a steroid for pain and inflammation.  Do not use NSAIDs while taking this. (Ibuprofen , Advil , Aleve, naproxen) You can use tylenol  safely   Flexeril  -- 1 tablet at bedtime as needed. This is a muscle relaxer. It might make you sleepy!  Please call orthopedics to make a follow up appointment.

## 2024-10-15 NOTE — ED Notes (Signed)
 Shoulder immobilizer canceled. Pt didn't want sling

## 2024-10-15 NOTE — ED Triage Notes (Addendum)
 Pt c/o left side muscle pain at collar bone that radiates back to shoulder blade. States it is worse with movement and deep breathing.   States she was trying to work on a toilet about 1 week ago and pain began the day after that.

## 2024-10-22 ENCOUNTER — Other Ambulatory Visit: Payer: Self-pay

## 2024-10-22 ENCOUNTER — Ambulatory Visit: Admitting: Family

## 2024-10-22 DIAGNOSIS — S29011A Strain of muscle and tendon of front wall of thorax, initial encounter: Secondary | ICD-10-CM

## 2024-10-22 DIAGNOSIS — M79602 Pain in left arm: Secondary | ICD-10-CM | POA: Diagnosis not present

## 2024-10-22 MED ORDER — NAPROXEN 500 MG PO TABS: 500.0000 mg | ORAL_TABLET | Freq: Two times a day (BID) | ORAL | 0 refills | Status: AC | PRN

## 2024-10-22 NOTE — Progress Notes (Unsigned)
 Office Visit Note   Patient: Deborah Davenport           Date of Birth: [birth year]           MRN: [id number] Visit Date: 10/22/2024              Requested by: Loreli Kins, MD [address] [place],  [place] [address] PCP: Loreli Kins, MD  Chief Complaint  Patient presents with   Left Shoulder - Pain      HPI: The patient is a 58 year old woman who presents for evaluation of a 2-week history of left shoulder pain which radiates into her shoulder blade.  She is having collarbone pain as well.  Pain with range of motion of the shoulder as well as deep breath she has not had any numbness or tingling no neck pain no neck stiffness  Has used ibuprofen  and Excedrin without relief  History of osteopenia  Seen by urgent care about a week ago was placed on a prednisone  burst for 5 days  Radiographs performed at the time were read as no fracture however cortical irregularity noted  Assessment & Plan: Visit Diagnoses:  1. Strain of left pectoralis muscle, initial encounter   2. Left arm pain     Plan: muscle strain. Discussed heat, antiinflammatories, offered PT. Will start with HEP. Provided AAOS handout.   Follow-Up Instructions: No follow-ups on file.   Ortho Exam  Patient is alert, oriented, no adenopathy, well-dressed, normal affect, normal respiratory effort. Soft tissue tenderness left chest and over scapular border. No bony tenderness.     Imaging: XR Cervical Spine 2 or 3 views Result Date: 10/22/2024 Radiographs of cervical spine show well preserved disc spaces well preserved. No acute finding. No spondylolisthesis.  No images are attached to the encounter.  Labs: No results found for: HGBA1C, ESRSEDRATE, CRP, LABURIC, REPTSTATUS, GRAMSTAIN, CULT, LABORGA   No results found for: ALBUMIN, PREALBUMIN, CBC  No results found for: MG No results found for: VD25OH  No results found for: PREALBUMIN    Latest Ref Rng & Units  04/08/2012    1:37 PM 01/21/2008   11:30 AM 12/08/2007    9:55 AM  CBC EXTENDED  WBC 4.5 - 10.5 10*3/microliter  5.6  5.6   RBC 3.87 - 5.11 M/uL  4.37  4.48   Hemoglobin 12.0 - 15.0 g/dL 86.6  86.4  86.1   HCT 36.0 - 46.0 % 39.0  40.7  40.8   Platelets 150 - 400 K/uL  306  323   NEUT# 1.4 - 7.7 K/uL  3.3  3.7   Lymph#    1.4      There is no height or weight on file to calculate BMI.  Orders:  Orders Placed This Encounter  Procedures   XR Cervical Spine 2 or 3 views   Meds ordered this encounter  Medications   naproxen (NAPROSYN) 500 MG tablet    Sig: Take 1 tablet (500 mg total) by mouth 2 (two) times daily as needed for mild pain (pain score 1-3) or moderate pain (pain score 4-6). With meals    Dispense:  60 tablet    Refill:  0     Procedures: No procedures performed  Clinical Data: No additional findings.  ROS:  All other systems negative, except as noted in the HPI. Review of Systems  Objective: Vital Signs: LMP 09/30/2012   Specialty Comments:  No specialty comments available.  PMFS History: There are no active problems  to display for this patient.  Past Medical History:  Diagnosis Date   Hypercholesterolemia     Family History  Problem Relation Age of Onset   Hypertension Other    Hyperlipidemia Other    Cancer Other     Past Surgical History:  Procedure Laterality Date   CARDIAC ELECTROPHYSIOLOGY STUDY AND ABLATION     Social History   Occupational History   Not on file  Tobacco Use   Smoking status: Never   Smokeless tobacco: Not on file  Substance and Sexual Activity   Alcohol use: No   Drug use: No   Sexual activity: Not on file
